# Patient Record
Sex: Female | Born: 1980 | Race: Black or African American | Hispanic: No | Marital: Single | Smoking: Current some day smoker
Health system: Southern US, Community
[De-identification: ages and names within clinical notes are randomized; demographics above are authoritative.]

## PROBLEM LIST (undated history)

## (undated) HISTORY — PX: ABDOMINAL HYSTERECTOMY: SHX81

---

## 2015-01-23 ENCOUNTER — Emergency Department: Payer: Self-pay

## 2015-01-23 ENCOUNTER — Encounter: Payer: Self-pay | Admitting: Emergency Medicine

## 2015-01-23 ENCOUNTER — Emergency Department
Admission: EM | Admit: 2015-01-23 | Discharge: 2015-01-24 | Disposition: A | Discharge status: Home / Self Care | Payer: Self-pay | Attending: Emergency Medicine | Admitting: Emergency Medicine

## 2015-01-23 DIAGNOSIS — Z3202 Encounter for pregnancy test, result negative: Secondary | ICD-10-CM | POA: Insufficient documentation

## 2015-01-23 DIAGNOSIS — R1030 Lower abdominal pain, unspecified: Secondary | ICD-10-CM | POA: Insufficient documentation

## 2015-01-23 DIAGNOSIS — F17210 Nicotine dependence, cigarettes, uncomplicated: Secondary | ICD-10-CM | POA: Insufficient documentation

## 2015-01-23 LAB — COMPREHENSIVE METABOLIC PANEL
ALT: 15 U/L (ref 14–54)
AST: 15 U/L (ref 15–41)
Albumin: 3.8 g/dL (ref 3.5–5.0)
Alkaline Phosphatase: 46 U/L (ref 38–126)
Anion gap: 5 (ref 5–15)
BILIRUBIN TOTAL: 0.6 mg/dL (ref 0.3–1.2)
BUN: 7 mg/dL (ref 6–20)
CO2: 28 mmol/L (ref 22–32)
CREATININE: 0.85 mg/dL (ref 0.44–1.00)
Calcium: 9.3 mg/dL (ref 8.9–10.3)
Chloride: 105 mmol/L (ref 101–111)
Glucose, Bld: 96 mg/dL (ref 65–99)
POTASSIUM: 3.7 mmol/L (ref 3.5–5.1)
SODIUM: 138 mmol/L (ref 135–145)
TOTAL PROTEIN: 7.5 g/dL (ref 6.5–8.1)

## 2015-01-23 LAB — URINALYSIS COMPLETE WITH MICROSCOPIC (ARMC ONLY)
BILIRUBIN URINE: NEGATIVE
Bacteria, UA: NONE SEEN
GLUCOSE, UA: NEGATIVE mg/dL
Ketones, ur: NEGATIVE mg/dL
LEUKOCYTES UA: NEGATIVE
NITRITE: NEGATIVE
Protein, ur: NEGATIVE mg/dL
SPECIFIC GRAVITY, URINE: 1.023 (ref 1.005–1.030)
pH: 5 (ref 5.0–8.0)

## 2015-01-23 LAB — WET PREP, GENITAL
Clue Cells Wet Prep HPF POC: NONE SEEN
SPERM: NONE SEEN
Trich, Wet Prep: NONE SEEN
Yeast Wet Prep HPF POC: NONE SEEN

## 2015-01-23 LAB — LIPASE, BLOOD: LIPASE: 35 U/L (ref 11–51)

## 2015-01-23 LAB — CBC
HEMATOCRIT: 39.8 % (ref 35.0–47.0)
Hemoglobin: 13.1 g/dL (ref 12.0–16.0)
MCH: 28.3 pg (ref 26.0–34.0)
MCHC: 32.8 g/dL (ref 32.0–36.0)
MCV: 86.2 fL (ref 80.0–100.0)
PLATELETS: 204 10*3/uL (ref 150–440)
RBC: 4.62 MIL/uL (ref 3.80–5.20)
RDW: 13.3 % (ref 11.5–14.5)
WBC: 9 10*3/uL (ref 3.6–11.0)

## 2015-01-23 LAB — CHLAMYDIA/NGC RT PCR (ARMC ONLY)
CHLAMYDIA TR: NOT DETECTED
N GONORRHOEAE: NOT DETECTED

## 2015-01-23 LAB — PREGNANCY, URINE: Preg Test, Ur: NEGATIVE

## 2015-01-23 MED ORDER — OXYCODONE-ACETAMINOPHEN 5-325 MG PO TABS: 1.0000 | ORAL_TABLET | Freq: Four times a day (QID) | ORAL | Status: AC | PRN

## 2015-01-23 MED ORDER — IOHEXOL 240 MG/ML SOLN
25.0000 mL | Freq: Once | INTRAMUSCULAR | Status: AC | PRN
  Administered 2015-01-23: 25 mL via ORAL

## 2015-01-23 MED ORDER — IOHEXOL 300 MG/ML  SOLN
125.0000 mL | Freq: Once | INTRAMUSCULAR | Status: AC | PRN
  Administered 2015-01-23: 125 mL via INTRAVENOUS

## 2015-01-23 MED ORDER — ONDANSETRON HCL 4 MG/2ML IJ SOLN
4.0000 mg | Freq: Once | INTRAMUSCULAR | Status: AC
  Administered 2015-01-23: 4 mg via INTRAVENOUS
  Filled 2015-01-23: qty 2

## 2015-01-23 MED ORDER — MORPHINE SULFATE (PF) 4 MG/ML IV SOLN
4.0000 mg | Freq: Once | INTRAVENOUS | Status: AC
  Administered 2015-01-23: 4 mg via INTRAVENOUS
  Filled 2015-01-23: qty 1

## 2015-01-23 MED ORDER — TRAMADOL HCL 50 MG PO TABS
50.0000 mg | ORAL_TABLET | Freq: Once | ORAL | Status: AC
  Administered 2015-01-23: 50 mg via ORAL
  Filled 2015-01-23: qty 1

## 2015-01-23 NOTE — Discharge Instructions (Signed)

## 2015-01-23 NOTE — ED Notes (Addendum)
Pt c/o lower abdominal pain since this morning at approx 10am. Pt denies hx of kidney stones, or UTI's at this time. Pt states hx of bacterial vaginosis at this time. Pt states that she has been having worse gas today than normal. Pt denies vaginal discharge at this time, states urine is yellow at this time. Pt denies N/V/D at this time. Pt states last BM x 3 days ago.

## 2015-01-23 NOTE — ED Notes (Signed)
Pelvic completed with Dr. Lenard Lance.

## 2015-01-23 NOTE — ED Notes (Signed)
Graham crackers, peanut butter, and drink given to pt's daughter.

## 2015-01-23 NOTE — ED Provider Notes (Addendum)
[facility] Emergency Department Provider Note  Time seen: 5:25 PM  I have reviewed the triage vital signs and the nursing notes.   HISTORY  Chief Complaint Abdominal Pain    HPI Scarlettrose Costilow is a 35 y.o. female with no past medical history who presents to the emergency department with lower abdominal pain. According to the patient she developed lower abdominal pain around 10 AM this morning. States his been fairly constant since then. States it feels like "gas pains." Denies dysuria, hematuria, vaginal bleeding or discharge. Denies nausea, vomiting, diarrhea. Denies history of kidney stones. Describes the pain as moderate, cannot describe any associated or modifying factors.     History reviewed. No pertinent past medical history.  There are no active problems to display for this patient.   Past Surgical History  Procedure Laterality Date  . Abdominal hysterectomy      No current outpatient prescriptions on file.  Allergies Review of patient's allergies indicates no known allergies.  History reviewed. No pertinent family history.  Social History Social History  Substance Use Topics  . Smoking status: Current Some Day Smoker    Types: Cigarettes  . Smokeless tobacco: Never Used  . Alcohol Use: No    Review of Systems Constitutional: Negative for fever. Cardiovascular: Negative for chest pain. Respiratory: Negative for shortness of breath. Gastrointestinal: Lower abdominal pain. Negative for nausea, vomiting, diarrhea. Genitourinary: Negative for dysuria. Negative for hematuria. Negative for vaginal bleeding or discharge. Musculoskeletal: Negative for back pain. Neurological: Negative for headache Allergic/Immunilogical: **} 10-point ROS otherwise negative.  ____________________________________________   PHYSICAL EXAM:  VITAL SIGNS: ED Triage Vitals  Enc Vitals Group     BP 01/23/15 1508 149/94 mmHg     Pulse Rate 01/23/15 1508  86     Resp 01/23/15 1508 18     Temp 01/23/15 1508 98 F (36.7 C)     Temp Source 01/23/15 1508 Oral     SpO2 01/23/15 1508 100 %     Weight 01/23/15 1508 257 lb (116.574 kg)     Height 01/23/15 1508  (1.575 m)     Head Cir --      Peak Flow --      Pain Score 01/23/15 1509 8     Pain Loc --      Pain Edu? --      Excl. in GC? --     Constitutional: Alert and oriented. Well appearing and in no distress. Eyes: Normal exam ENT   Head: Normocephalic and atraumatic.   Mouth/Throat: Mucous membranes are moist. Cardiovascular: Normal rate, regular rhythm. No murmur Respiratory: Normal respiratory effort without tachypnea nor retractions. Breath sounds are clear and equal bilaterally. No wheezes/rales/rhonchi. Gastrointestinal: Soft, mild lower abdominal tenderness to palpation. No rebound or guarding. No distention. Musculoskeletal: Nontender with normal range of motion in all extremities.  Neurologic:  Normal speech and language. No gross focal neurologic deficits  Skin:  Skin is warm, dry and intact.  Psychiatric: Mood and affect are normal. Speech and behavior are normal.   ____________________________________________    INITIAL IMPRESSION / ASSESSMENT AND PLAN / ED COURSE  Pertinent labs & imaging results that were available during my care of the patient were reviewed by me and considered in my medical decision making (see chart for details).  Patient says with lower abdominal pain for the past 7 hours. States the pain has been fairly constant. Denies nausea, vomiting, diarrhea, hematuria, dysuria, vaginal bleeding, vaginal discharge. Patient's labs are  largely within normal limits, currently awaiting. Point of care pregnancy test as well as urinalysis.  Patient now states her abdominal pain is increased she is now drinking it as a 10/10. States the same area suprapubic location. Urinalysis has resulted normal. We will place an IV, dose pain medication and proceed  with pelvic exam. Pelvic exam is normal we'll proceed with a CT abdomen/pelvis to further evaluate. Pelvic is abnormal we'll proceed with an ultrasound to further evaluate.  Pelvic exam shows minimal cervical discharge. No cervical motion tenderness. No adnexal tenderness. We'll proceed with a CT abdomen/pelvis to further evaluate.  CT shows no acute abnormality. Patient continues to be in significant discomfort. We will proceed with ultrasound to further evaluate.  Labs are within normal limits including negative wet prep and GC/chlamydia. Pain is somewhat improved. Currently awaiting ultrasound results. Patient care signed out to Dr. Zenda Alpers. ____________________________________________   FINAL CLINICAL IMPRESSION(S) / ED DIAGNOSES  Lower abdominal pain   Minna Antis, MD 01/23/15 1940  Minna Antis, MD 01/23/15 2247

## 2015-01-24 NOTE — ED Provider Notes (Signed)
-----------------------------------------   1:00 AM on 01/24/2015 -----------------------------------------   Blood pressure 148/96, pulse 77, temperature 98 F (36.7 C), temperature source Oral, resp. rate 16, height  (1.575 m), weight 257 lb (116.574 kg), last menstrual period 01/06/2015, SpO2 96 %.  Assuming care from Dr. Lenard Lance.  In short, Amy Mullen is a 35 y.o. female with a chief complaint of Abdominal Pain .  Refer to the original H&P for additional details.  The current plan of care is to follow up the results of the [place].   Ultrasound shows an unremarkable pelvic ultrasound, no evidence for ovarian torsion.  I did explain to the patient the results of her ultrasound. She seemed emotional that nothing was found but I informed her that she should follow-up with an OB/GYN which is a specialist for further evaluation of the symptoms that she is having. The patient will be discharged home to follow-up with OB/GYN.  [name], MD 01/24/15 [phone]

## 2017-06-10 IMAGING — CT CT ABD-PELV W/ CM
1 of 2 series · 15 of 32 positions shown, 19 images · IV contrast (omnipaque)
Comparison: None.

CLINICAL DATA: Patient complains of lower abdominal pain since 10AM

EXAM:
CT ABDOMEN AND PELVIS WITH CONTRAST
TECHNIQUE: Multidetector CT imaging of the abdomen and pelvis was performed
using the standard protocol following bolus administration of
intravenous contrast.
CONTRAST:  125mL OMNIPAQUE IOHEXOL 300 MG/ML  SOLN

[Series 2: routine abd pel with · axial · 0.92mm/px · z∈[-515,-80]mm · 15 of 97 slices shown, 19 images]
[im 5/97  soft-tissue]
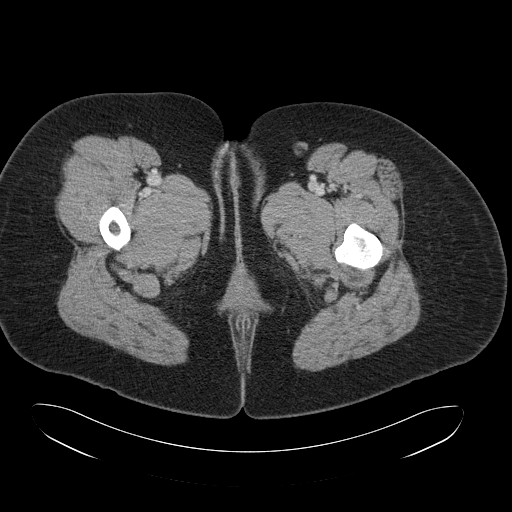
[im 5/97  bone]
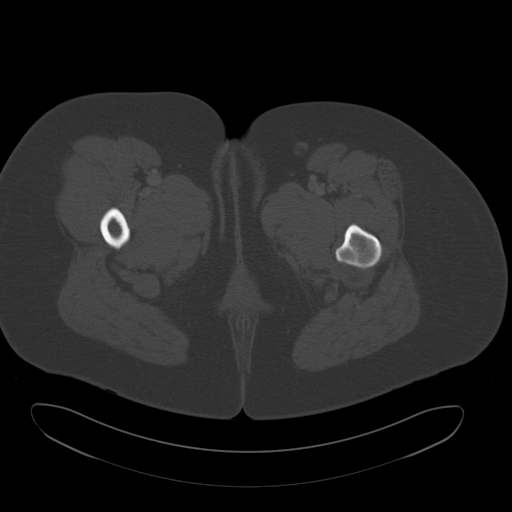
[im 13/97  soft-tissue]
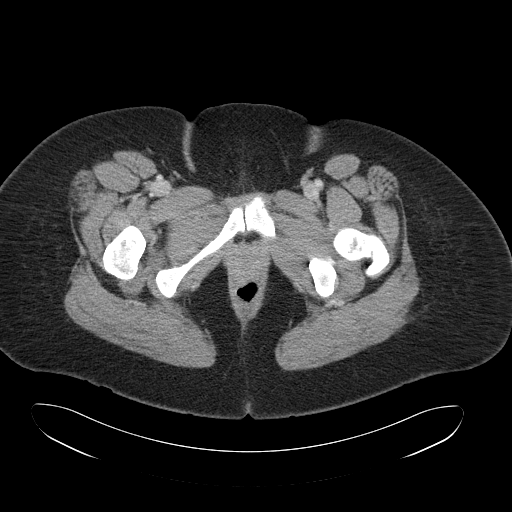
[im 21/97  soft-tissue]
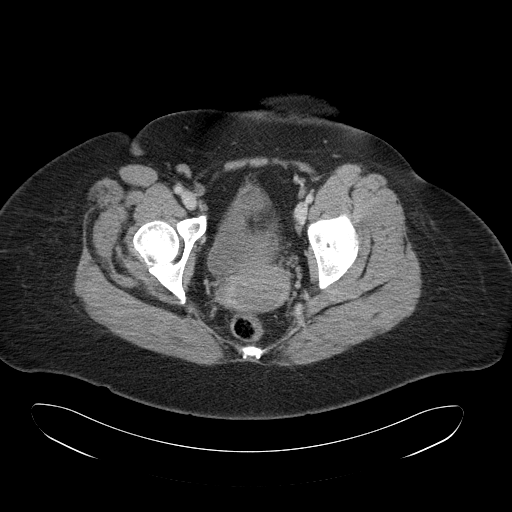
[im 26/97  soft-tissue]
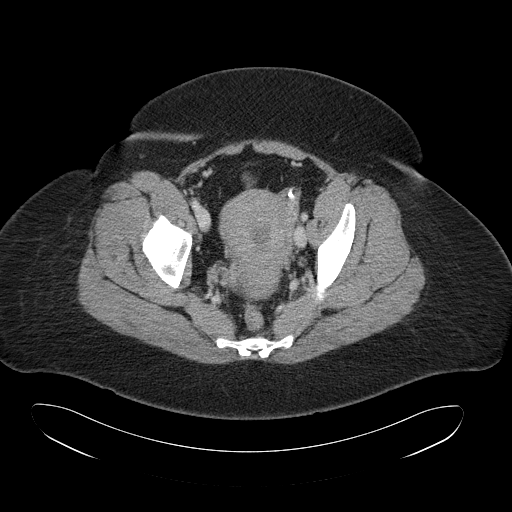
[im 34/97  soft-tissue]
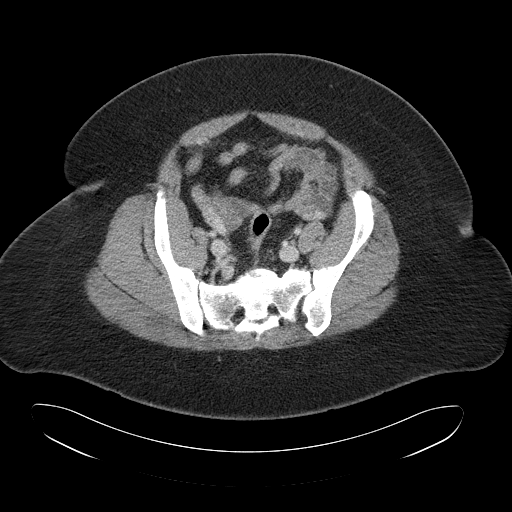
[im 42/97  soft-tissue]
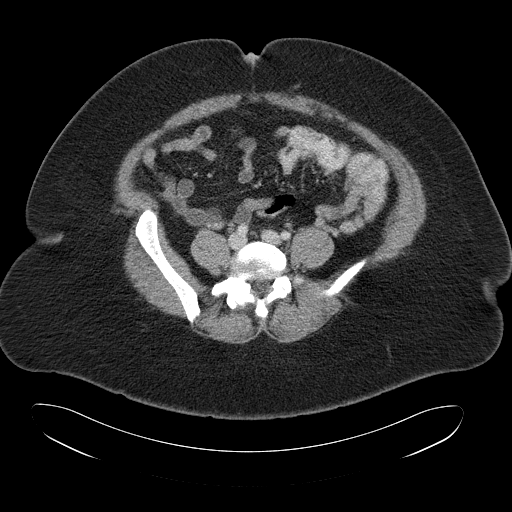
[im 51/97  soft-tissue]
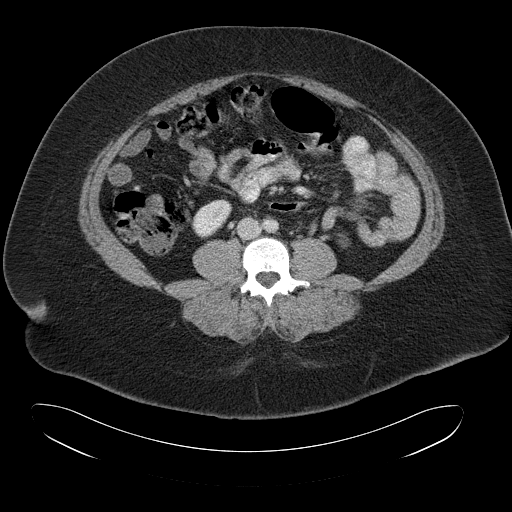
[im 55/97  soft-tissue]
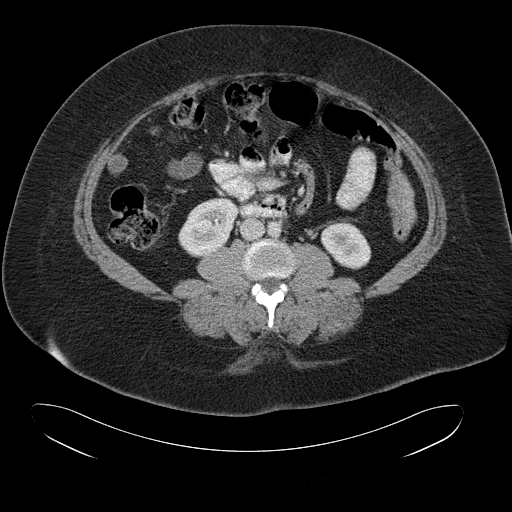
[im 63/97  soft-tissue]
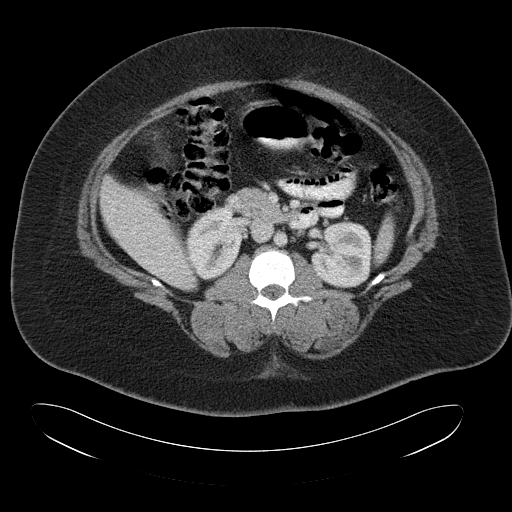
[im 63/97  bone]
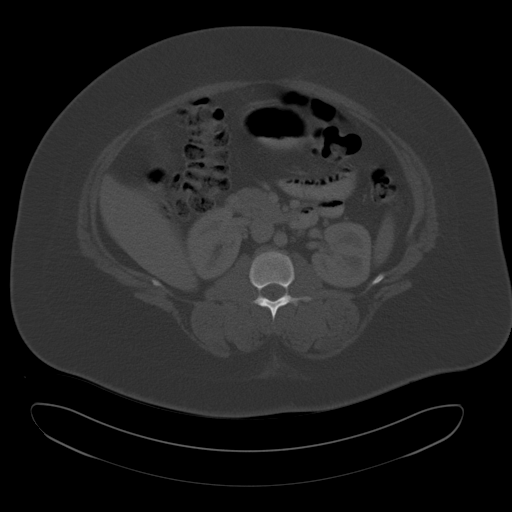
[im 71/97  soft-tissue]
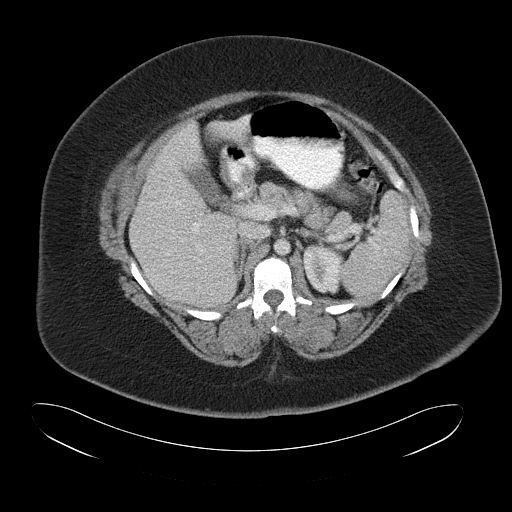
[im 76/97  soft-tissue]
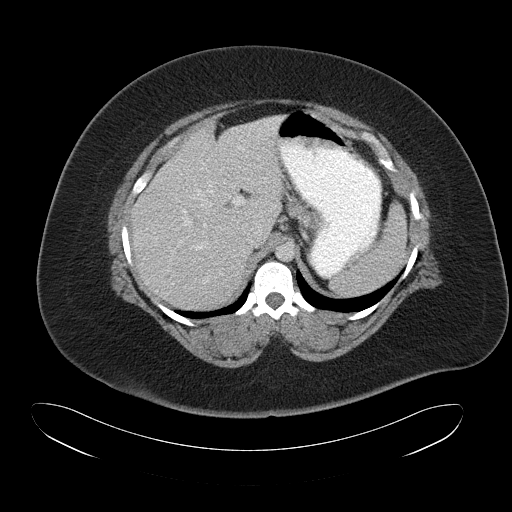
[im 80/97  lung]
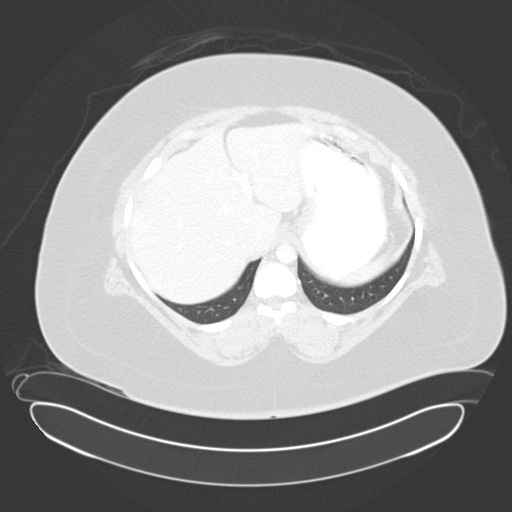
[im 84/97  soft-tissue]
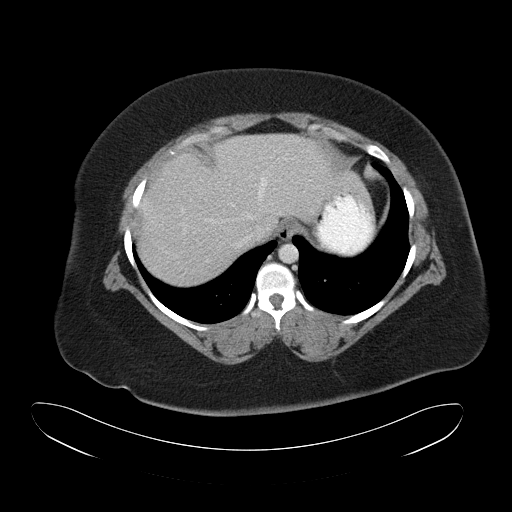
[im 84/97  lung]
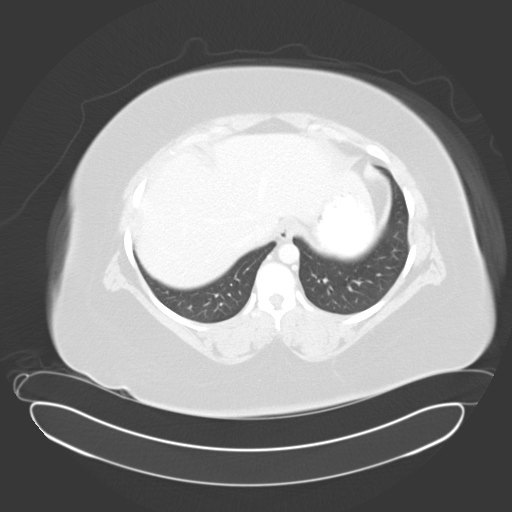
[im 88/97  lung]
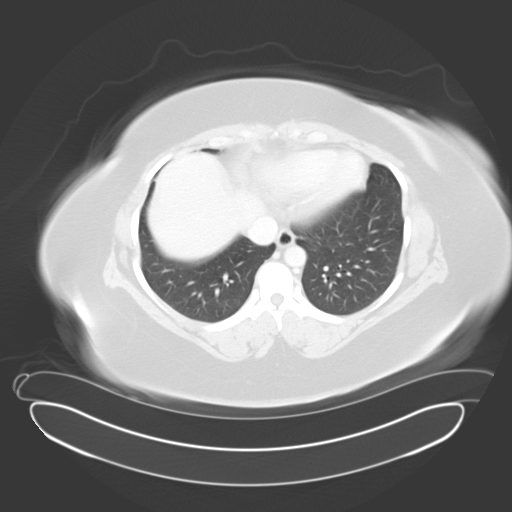
[im 92/97  soft-tissue]
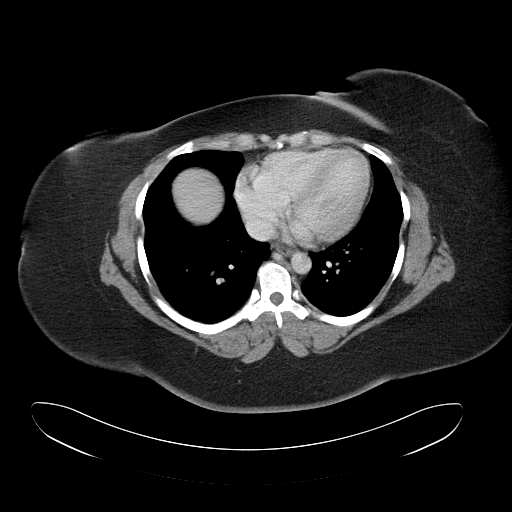
[im 92/97  lung]
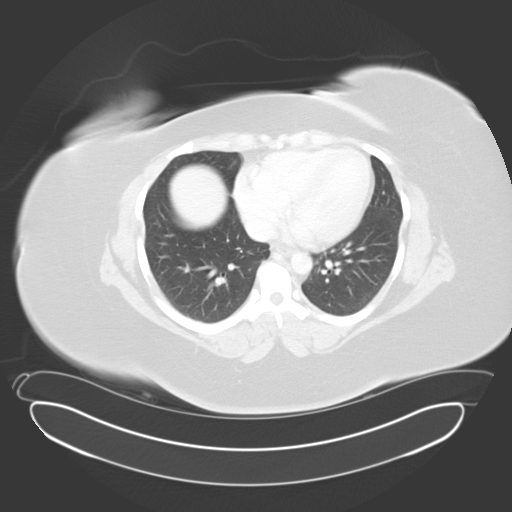

[15 of 32 positions shown; findings below may reference images not displayed]

FINDINGS: Lower chest:  No acute findings.

Hepatobiliary: Normal

Pancreas: Normal

Spleen: Normal

Adrenals/Urinary Tract: 12 mm low-attenuation lesion interpolar
region left kidney with average attenuation value of 30. 1 mm stone
lower pole right kidney. No perinephric inflammation. No
hydronephrosis. Bladder normal.

Stomach/Bowel: Stomach small bowel appendix and large bowel are
normal.

Vascular/Lymphatic: Negative

Reproductive: Pelvic organs appear normal. Bilateral tubal ligation
noted.

Other: No free fluid

Musculoskeletal: No acute findings
IMPRESSION: No acute findings. 12 mm low-attenuation lesion lower pole left
kidney indeterminate by CT scan. It may be a very small cyst.
Consider nonemergent renal ultrasound to confirm this.
